# Patient Record
Sex: Female | Born: 1980 | Race: White | Hispanic: No | Marital: Married | Smoking: Never smoker
Health system: Southern US, Community
[De-identification: ages and names within clinical notes are randomized; demographics above are authoritative.]

## PROBLEM LIST (undated history)

## (undated) DIAGNOSIS — F419 Anxiety disorder, unspecified: Secondary | ICD-10-CM

## (undated) HISTORY — DX: Anxiety disorder, unspecified: F41.9

---
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
## 2020-10-22 NOTE — L&D Delivery Note (Signed)
Delivery Note Patient pushed well for 10 minutes.  At 2:12 PM a viable female was delivered via Vaginal, Spontaneous (Presentation: Left Occiput Anterior).  APGAR: 8, 9; weight 3969 gm (8lb 12oz)  .   Placenta status: Spontaneous, Intact.  Cord: 3 vessels with the following complications: None.  Cord pH: n/a  Anesthesia: Epidural Episiotomy: None Lacerations: 2nd degree Suture Repair: 2.0 vicryl rapide Est. Blood Loss (mL): 300  Mom to postpartum.  Baby to Couplet care / Skin to Skin.  Ashley [name] 08/09/2021, 3:01 PM

## 2020-12-29 ENCOUNTER — Encounter: Payer: Self-pay | Admitting: Obstetrics and Gynecology

## 2020-12-29 LAB — OB RESULTS CONSOLE GC/CHLAMYDIA
Chlamydia: NEGATIVE
Gonorrhea: NEGATIVE

## 2021-01-20 ENCOUNTER — Other Ambulatory Visit: Payer: Self-pay | Admitting: Obstetrics and Gynecology

## 2021-01-20 LAB — OB RESULTS CONSOLE ANTIBODY SCREEN: Antibody Screen: NEGATIVE

## 2021-01-20 LAB — OB RESULTS CONSOLE HIV ANTIBODY (ROUTINE TESTING): HIV: NONREACTIVE

## 2021-01-20 LAB — OB RESULTS CONSOLE ABO/RH: RH Type: POSITIVE

## 2021-01-20 LAB — OB RESULTS CONSOLE RUBELLA ANTIBODY, IGM: Rubella: IMMUNE

## 2021-01-20 LAB — OB RESULTS CONSOLE HEPATITIS B SURFACE ANTIGEN: Hepatitis B Surface Ag: NEGATIVE

## 2021-01-20 LAB — OB RESULTS CONSOLE RPR: RPR: NONREACTIVE

## 2021-02-21 ENCOUNTER — Other Ambulatory Visit: Payer: Self-pay | Admitting: Obstetrics and Gynecology

## 2021-02-23 ENCOUNTER — Other Ambulatory Visit: Payer: Self-pay | Admitting: Obstetrics and Gynecology

## 2021-02-23 DIAGNOSIS — Z3A19 19 weeks gestation of pregnancy: Secondary | ICD-10-CM

## 2021-02-23 DIAGNOSIS — O09522 Supervision of elderly multigravida, second trimester: Secondary | ICD-10-CM

## 2021-02-23 DIAGNOSIS — Z363 Encounter for antenatal screening for malformations: Secondary | ICD-10-CM

## 2021-03-07 ENCOUNTER — Telehealth: Payer: Self-pay

## 2021-03-07 NOTE — Telephone Encounter (Signed)
Called to discuss with patient about COVID-19 symptoms and the use of one of the available treatments for those with mild to moderate Covid symptoms and at a high risk of hospitalization.  Pt appears to qualify for outpatient treatment due to co-morbid conditions and/or a member of an at-risk group in accordance with the FDA Emergency Use Authorization.    Symptom onset: Monday, Mar 06, 2021 Vaccinated: Yes Booster: Yes Immunocompromised: No Qualifiers: Pregnant NIH Criteria: Tier 3?  Pt possibly interested in treatment RN informed pt an APP will follow up and discuss available options.   Essie Hart, RN

## 2021-03-08 ENCOUNTER — Telehealth: Payer: Self-pay | Admitting: Adult Health

## 2021-03-08 NOTE — Telephone Encounter (Signed)
Called to discuss with patient about COVID-19 symptoms and the use of one of the available treatments for those with mild to moderate Covid symptoms and at a high risk of hospitalization.  Pt appears to qualify for outpatient treatment due to co-morbid conditions and/or a member of an at-risk group in accordance with the FDA Emergency Use Authorization.    Ashley [name] is feeling better today and I reviewed treatment options with her including paxlovid and bebtelovimab.  She would like to wait one more day to see how she feels, as she is hesitant to take anything while pregnant.  I gave her our call back number.    Ashley [name]

## 2021-03-23 ENCOUNTER — Other Ambulatory Visit: Payer: Self-pay

## 2021-03-23 ENCOUNTER — Ambulatory Visit: Payer: BC Managed Care – PPO | Admitting: *Deleted

## 2021-03-23 ENCOUNTER — Encounter: Payer: Self-pay | Admitting: *Deleted

## 2021-03-23 ENCOUNTER — Other Ambulatory Visit: Payer: Self-pay | Admitting: *Deleted

## 2021-03-23 ENCOUNTER — Ambulatory Visit: Payer: BC Managed Care – PPO | Attending: Obstetrics and Gynecology

## 2021-03-23 VITALS — BP 117/62 | HR 101 | Ht 70.0 in

## 2021-03-23 DIAGNOSIS — Z3A19 19 weeks gestation of pregnancy: Secondary | ICD-10-CM | POA: Insufficient documentation

## 2021-03-23 DIAGNOSIS — Z3689 Encounter for other specified antenatal screening: Secondary | ICD-10-CM | POA: Diagnosis present

## 2021-03-23 DIAGNOSIS — Z363 Encounter for antenatal screening for malformations: Secondary | ICD-10-CM | POA: Diagnosis not present

## 2021-03-23 DIAGNOSIS — O09522 Supervision of elderly multigravida, second trimester: Secondary | ICD-10-CM | POA: Diagnosis present

## 2021-03-23 DIAGNOSIS — O09529 Supervision of elderly multigravida, unspecified trimester: Secondary | ICD-10-CM

## 2021-04-17 ENCOUNTER — Ambulatory Visit: Payer: BC Managed Care – PPO | Attending: Obstetrics and Gynecology

## 2021-04-17 ENCOUNTER — Encounter: Payer: Self-pay | Admitting: *Deleted

## 2021-04-17 ENCOUNTER — Other Ambulatory Visit: Payer: Self-pay

## 2021-04-17 ENCOUNTER — Ambulatory Visit: Payer: BC Managed Care – PPO | Admitting: *Deleted

## 2021-04-17 VITALS — BP 119/77 | HR 97

## 2021-04-17 DIAGNOSIS — O09529 Supervision of elderly multigravida, unspecified trimester: Secondary | ICD-10-CM

## 2021-04-17 DIAGNOSIS — O09522 Supervision of elderly multigravida, second trimester: Secondary | ICD-10-CM | POA: Diagnosis present

## 2021-07-26 LAB — OB RESULTS CONSOLE GBS: GBS: NEGATIVE

## 2021-07-27 ENCOUNTER — Other Ambulatory Visit: Payer: Self-pay | Admitting: Obstetrics and Gynecology

## 2021-08-09 ENCOUNTER — Other Ambulatory Visit: Payer: Self-pay

## 2021-08-09 ENCOUNTER — Inpatient Hospital Stay (HOSPITAL_COMMUNITY)
Admission: AD | Admit: 2021-08-09 | Discharge: 2021-08-10 | DRG: 807 | Disposition: A | Discharge status: Home / Self Care | Payer: BC Managed Care – PPO | Attending: Obstetrics | Admitting: Obstetrics

## 2021-08-09 ENCOUNTER — Inpatient Hospital Stay (HOSPITAL_COMMUNITY): Payer: BC Managed Care – PPO

## 2021-08-09 ENCOUNTER — Encounter (HOSPITAL_COMMUNITY): Payer: Self-pay | Admitting: Obstetrics and Gynecology

## 2021-08-09 ENCOUNTER — Inpatient Hospital Stay (HOSPITAL_COMMUNITY): Payer: BC Managed Care – PPO | Admitting: Anesthesiology

## 2021-08-09 DIAGNOSIS — Z3A39 39 weeks gestation of pregnancy: Secondary | ICD-10-CM | POA: Diagnosis not present

## 2021-08-09 DIAGNOSIS — Z20822 Contact with and (suspected) exposure to covid-19: Secondary | ICD-10-CM | POA: Diagnosis present

## 2021-08-09 DIAGNOSIS — Z23 Encounter for immunization: Secondary | ICD-10-CM | POA: Diagnosis not present

## 2021-08-09 DIAGNOSIS — O26893 Other specified pregnancy related conditions, third trimester: Secondary | ICD-10-CM | POA: Diagnosis present

## 2021-08-09 DIAGNOSIS — O09523 Supervision of elderly multigravida, third trimester: Secondary | ICD-10-CM | POA: Diagnosis present

## 2021-08-09 LAB — RESP PANEL BY RT-PCR (FLU A&B, COVID) ARPGX2
Influenza A by PCR: NEGATIVE
Influenza B by PCR: NEGATIVE
SARS Coronavirus 2 by RT PCR: NEGATIVE

## 2021-08-09 LAB — CBC
HCT: 35.2 % — ABNORMAL LOW (ref 36.0–46.0)
Hemoglobin: 11.1 g/dL — ABNORMAL LOW (ref 12.0–15.0)
MCH: 25.3 pg — ABNORMAL LOW (ref 26.0–34.0)
MCHC: 31.5 g/dL (ref 30.0–36.0)
MCV: 80.2 fL (ref 80.0–100.0)
Platelets: 302 10*3/uL (ref 150–400)
RBC: 4.39 MIL/uL (ref 3.87–5.11)
RDW: 15.3 % (ref 11.5–15.5)
WBC: 9.8 10*3/uL (ref 4.0–10.5)
nRBC: 0 % (ref 0.0–0.2)

## 2021-08-09 LAB — RPR: RPR Ser Ql: NONREACTIVE

## 2021-08-09 LAB — TYPE AND SCREEN
ABO/RH(D): O POS
Antibody Screen: NEGATIVE

## 2021-08-09 MED ORDER — OXYCODONE HCL 5 MG PO TABS: 5.0000 mg | ORAL_TABLET | ORAL | Status: DC | PRN

## 2021-08-09 MED ORDER — BENZOCAINE-MENTHOL 20-0.5 % EX AERO
1.0000 "application " | INHALATION_SPRAY | CUTANEOUS | Status: DC | PRN
  Administered 2021-08-10: 1 via TOPICAL
  Filled 2021-08-09: qty 56

## 2021-08-09 MED ORDER — LIDOCAINE HCL (PF) 1 % IJ SOLN
INTRAMUSCULAR | Status: DC | PRN
  Administered 2021-08-09 (×2): 5 mL via EPIDURAL

## 2021-08-09 MED ORDER — ACETAMINOPHEN 325 MG PO TABS: 650.0000 mg | ORAL_TABLET | ORAL | Status: DC | PRN

## 2021-08-09 MED ORDER — OXYTOCIN-SODIUM CHLORIDE 30-0.9 UT/500ML-% IV SOLN: 2.5000 [IU]/h | INTRAVENOUS | Status: DC

## 2021-08-09 MED ORDER — EPHEDRINE 5 MG/ML INJ: 10.0000 mg | INTRAVENOUS | Status: DC | PRN

## 2021-08-09 MED ORDER — ONDANSETRON HCL 4 MG/2ML IJ SOLN: 4.0000 mg | INTRAMUSCULAR | Status: DC | PRN

## 2021-08-09 MED ORDER — OXYCODONE-ACETAMINOPHEN 5-325 MG PO TABS: 1.0000 | ORAL_TABLET | ORAL | Status: DC | PRN

## 2021-08-09 MED ORDER — ONDANSETRON HCL 4 MG/2ML IJ SOLN: 4.0000 mg | Freq: Four times a day (QID) | INTRAMUSCULAR | Status: DC | PRN

## 2021-08-09 MED ORDER — FLEET ENEMA 7-19 GM/118ML RE ENEM: 1.0000 | ENEMA | RECTAL | Status: DC | PRN

## 2021-08-09 MED ORDER — FENTANYL-BUPIVACAINE-NACL 0.5-0.125-0.9 MG/250ML-% EP SOLN
EPIDURAL | Status: AC
  Filled 2021-08-09: qty 250

## 2021-08-09 MED ORDER — TETANUS-DIPHTH-ACELL PERTUSSIS 5-2.5-18.5 LF-MCG/0.5 IM SUSY: 0.5000 mL | PREFILLED_SYRINGE | Freq: Once | INTRAMUSCULAR | Status: DC

## 2021-08-09 MED ORDER — DIBUCAINE (PERIANAL) 1 % EX OINT: 1.0000 "application " | TOPICAL_OINTMENT | CUTANEOUS | Status: DC | PRN

## 2021-08-09 MED ORDER — SOD CITRATE-CITRIC ACID 500-334 MG/5ML PO SOLN: 30.0000 mL | ORAL | Status: DC | PRN

## 2021-08-09 MED ORDER — WITCH HAZEL-GLYCERIN EX PADS
1.0000 "application " | MEDICATED_PAD | CUTANEOUS | Status: DC | PRN
  Administered 2021-08-10: 1 via TOPICAL

## 2021-08-09 MED ORDER — INFLUENZA VAC SPLIT QUAD 0.5 ML IM SUSY
0.5000 mL | PREFILLED_SYRINGE | INTRAMUSCULAR | Status: AC
  Administered 2021-08-10: 0.5 mL via INTRAMUSCULAR
  Filled 2021-08-09: qty 0.5

## 2021-08-09 MED ORDER — OXYTOCIN BOLUS FROM INFUSION
333.0000 mL | Freq: Once | INTRAVENOUS | Status: AC
  Administered 2021-08-09: 333 mL via INTRAVENOUS

## 2021-08-09 MED ORDER — COCONUT OIL OIL
1.0000 "application " | TOPICAL_OIL | Status: DC | PRN
  Administered 2021-08-10: 1 via TOPICAL

## 2021-08-09 MED ORDER — OXYCODONE-ACETAMINOPHEN 5-325 MG PO TABS: 2.0000 | ORAL_TABLET | ORAL | Status: DC | PRN

## 2021-08-09 MED ORDER — FAMOTIDINE 20 MG PO TABS: 40.0000 mg | ORAL_TABLET | Freq: Every day | ORAL | Status: DC | PRN

## 2021-08-09 MED ORDER — LIDOCAINE HCL (PF) 1 % IJ SOLN: 30.0000 mL | INTRAMUSCULAR | Status: DC | PRN

## 2021-08-09 MED ORDER — MISOPROSTOL 25 MCG QUARTER TABLET
25.0000 ug | ORAL_TABLET | ORAL | Status: DC | PRN
  Administered 2021-08-09: 25 ug via VAGINAL
  Filled 2021-08-09 (×2): qty 1

## 2021-08-09 MED ORDER — TERBUTALINE SULFATE 1 MG/ML IJ SOLN: 0.2500 mg | Freq: Once | INTRAMUSCULAR | Status: DC | PRN

## 2021-08-09 MED ORDER — ONDANSETRON HCL 4 MG PO TABS: 4.0000 mg | ORAL_TABLET | ORAL | Status: DC | PRN

## 2021-08-09 MED ORDER — LACTATED RINGERS IV SOLN
500.0000 mL | Freq: Once | INTRAVENOUS | Status: AC
  Administered 2021-08-09: 500 mL via INTRAVENOUS

## 2021-08-09 MED ORDER — LACTATED RINGERS IV SOLN
500.0000 mL | INTRAVENOUS | Status: DC | PRN
  Administered 2021-08-09: 500 mL via INTRAVENOUS

## 2021-08-09 MED ORDER — PHENYLEPHRINE 40 MCG/ML (10ML) SYRINGE FOR IV PUSH (FOR BLOOD PRESSURE SUPPORT): 80.0000 ug | PREFILLED_SYRINGE | INTRAVENOUS | Status: DC | PRN

## 2021-08-09 MED ORDER — IBUPROFEN 600 MG PO TABS
600.0000 mg | ORAL_TABLET | Freq: Four times a day (QID) | ORAL | Status: DC
  Administered 2021-08-09 – 2021-08-10 (×5): 600 mg via ORAL
  Filled 2021-08-09 (×5): qty 1

## 2021-08-09 MED ORDER — SENNOSIDES-DOCUSATE SODIUM 8.6-50 MG PO TABS
2.0000 | ORAL_TABLET | ORAL | Status: DC
  Administered 2021-08-09 – 2021-08-10 (×2): 2 via ORAL
  Filled 2021-08-09 (×2): qty 2

## 2021-08-09 MED ORDER — OXYCODONE HCL 5 MG PO TABS: 10.0000 mg | ORAL_TABLET | ORAL | Status: DC | PRN

## 2021-08-09 MED ORDER — FENTANYL-BUPIVACAINE-NACL 0.5-0.125-0.9 MG/250ML-% EP SOLN
12.0000 mL/h | EPIDURAL | Status: DC | PRN
  Administered 2021-08-09: 12 mL/h via EPIDURAL

## 2021-08-09 MED ORDER — FENTANYL CITRATE (PF) 100 MCG/2ML IJ SOLN: 100.0000 ug | INTRAMUSCULAR | Status: DC | PRN

## 2021-08-09 MED ORDER — DIPHENHYDRAMINE HCL 50 MG/ML IJ SOLN: 12.5000 mg | INTRAMUSCULAR | Status: DC | PRN

## 2021-08-09 MED ORDER — PHENYLEPHRINE 40 MCG/ML (10ML) SYRINGE FOR IV PUSH (FOR BLOOD PRESSURE SUPPORT)
80.0000 ug | PREFILLED_SYRINGE | INTRAVENOUS | Status: DC | PRN
  Filled 2021-08-09: qty 10

## 2021-08-09 MED ORDER — DIPHENHYDRAMINE HCL 25 MG PO CAPS: 25.0000 mg | ORAL_CAPSULE | Freq: Four times a day (QID) | ORAL | Status: DC | PRN

## 2021-08-09 MED ORDER — PRENATAL MULTIVITAMIN CH
1.0000 | ORAL_TABLET | Freq: Every day | ORAL | Status: DC
  Administered 2021-08-10: 1 via ORAL
  Filled 2021-08-09: qty 1

## 2021-08-09 MED ORDER — SIMETHICONE 80 MG PO CHEW: 80.0000 mg | CHEWABLE_TABLET | ORAL | Status: DC | PRN

## 2021-08-09 MED ORDER — OXYTOCIN-SODIUM CHLORIDE 30-0.9 UT/500ML-% IV SOLN
1.0000 m[IU]/min | INTRAVENOUS | Status: DC
  Administered 2021-08-09: 2 m[IU]/min via INTRAVENOUS
  Filled 2021-08-09: qty 500

## 2021-08-09 MED ORDER — BUTORPHANOL TARTRATE 1 MG/ML IJ SOLN: 1.0000 mg | INTRAMUSCULAR | Status: DC | PRN

## 2021-08-09 MED ORDER — LACTATED RINGERS IV SOLN: INTRAVENOUS | Status: DC

## 2021-08-09 NOTE — Anesthesia Procedure Notes (Signed)
Epidural Patient location during procedure: OB Start time: 08/09/2021 8:34 AM End time: 08/09/2021 9:02 AM  Staffing Anesthesiologist: Heather Roberts, MD Performed: anesthesiologist   Preanesthetic Checklist Completed: patient identified, IV checked, site marked, risks and benefits discussed, monitors and equipment checked, pre-op evaluation and timeout performed  Epidural Patient position: sitting Prep: DuraPrep Patient monitoring: heart rate, cardiac monitor, continuous pulse ox and blood pressure Approach: midline Location: L2-L3 Injection technique: LOR saline  Needle:  Needle type: Tuohy  Needle gauge: 17 G Needle length: 9 cm Needle insertion depth: 6 cm Catheter size: 20 Guage Catheter at skin depth: 11 cm Test dose: negative and Other  Assessment Events: blood not aspirated, injection not painful, no injection resistance and negative IV test  Additional Notes Informed consent obtained prior to proceeding including risk of failure, 1% risk of PDPH, risk of minor discomfort and bruising.  Discussed rare but serious complications including epidural abscess, permanent nerve injury, epidural hematoma.  Discussed alternatives to epidural analgesia and patient desires to proceed.  Timeout performed pre-procedure verifying patient name, procedure, and platelet count.  Patient tolerated procedure well.

## 2021-08-09 NOTE — H&P (Signed)
40 y.o. G2P1001 @ [redacted]w[redacted]d presents for  induction of labor for advanced maternal age.  Otherwise has good fetal movement and no bleeding.  Pregnancy complicated by: Advanced maternal age: low risk NIPT Failed 1hr gtt,passed 3hr gtt  Past Medical History:  Diagnosis Date   Anxiety     Past Surgical History:  Procedure Laterality Date   WISDOM TOOTH EXTRACTION  2002    OB History  Gravida Para Term Preterm AB Living  2 1 1     1   SAB IAB Ectopic Multiple Live Births          1    # Outcome Date GA Lbr Len/2nd Weight Sex Delivery Anes PTL Lv  2 Current           1 Term             Social History   Socioeconomic History   Marital status: Married    Spouse name: Not on file   Number of children: Not on file   Years of education: Not on file   Highest education level: Not on file  Occupational History   Not on file  Tobacco Use   Smoking status: Never   Smokeless tobacco: Never  Vaping Use   Vaping Use: Never used  Substance and Sexual Activity   Alcohol use: Not Currently   Drug use: Never   Sexual activity: Yes  Other Topics Concern   Not on file  Social History Narrative   Not on file   Social Determinants of Health   Financial Resource Strain: Not on file  Food Insecurity: Not on file  Transportation Needs: Not on file  Physical Activity: Not on file  Stress: Not on file  Social Connections: Not on file  Intimate Partner Violence: Not on file   Patient has no known allergies.    Prenatal Transfer Tool  Maternal Diabetes: No Genetic Screening: Normal Maternal Ultrasounds/Referrals: Normal Fetal Ultrasounds or other Referrals:  None Maternal Substance Abuse:  No Significant Maternal Medications:  None Significant Maternal Lab Results: Group B Strep negative  ABO, Rh: --/--/O POS (10/19 0018) Antibody: NEG (10/19 0018) Rubella: Immune (04/01 0000) RPR: Nonreactive (04/01 0000)  HBsAg: Negative (04/01 0000)  HIV: Non-reactive (04/01 0000)  GBS:  Negative/-- (10/05 0000)      Vitals:   08/09/21 0936 08/09/21 1002  BP: 106/64 (!) 101/50  Pulse: 80 (!) 108  Resp: 17   Temp:       General:  NAD Abdomen:  soft, gravid, EFW 8-8.5# Ex:  trace edema SVE:  5/60/-2, , cephalic, AROM clear fluid  FHTs:  130s, moderate variability, + accelerations and scalp stimulation Toco:  q2 minutes on 35mU of pitocin   A/P   40 y.o. G2P1001 [redacted]w[redacted]d presents for IOL for advanced maternal age IOL:  on pitocin, s/p AROM Anticipate SVD GBS negative  Ashley [name] Ashley [name]

## 2021-08-09 NOTE — Lactation Note (Signed)
This note was copied from a baby's chart. Lactation Consultation Note  Patient Name: Ashley [name] [id number] Date: 08/09/2021 Reason for consult: L&D Initial assessment Age:38 hours  P2, Ex BF.  Mother latched baby in cradle hold and repositioned for depth.  Provided pillows for support. Lactation to follow up on MBU.   Maternal Data Does the patient have breastfeeding experience prior to this delivery?: Yes  Feeding Mother's Current Feeding Choice: Breast Milk  LATCH Score Latch: Grasps breast easily, tongue down, lips flanged, rhythmical sucking.  Audible Swallowing: A few with stimulation  Type of Nipple: Everted at rest and after stimulation  Comfort (Breast/Nipple): Soft / non-tender  Hold (Positioning): Assistance needed to correctly position infant at breast and maintain latch.  LATCH Score: 8   Interventions Interventions: Assisted with latch;Skin to skin;Education   Consult Status Consult Status: Follow-up from L&D    Ashley [name] [facility] 08/09/2021, 2:58 PM

## 2021-08-09 NOTE — Anesthesia Preprocedure Evaluation (Signed)
Anesthesia Evaluation  Patient identified by MRN, date of birth, ID band Patient awake    Reviewed: Allergy & Precautions, NPO status , Patient's Chart, lab work & pertinent test results  Airway Mallampati: II  TM Distance: >3 FB Neck ROM: Full    Dental no notable dental hx. (+) Dental Advisory Given   Pulmonary neg pulmonary ROS,    Pulmonary exam normal        Cardiovascular negative cardio ROS Normal cardiovascular exam     Neuro/Psych PSYCHIATRIC DISORDERS Anxiety negative neurological ROS     GI/Hepatic negative GI ROS, Neg liver ROS,   Endo/Other  negative endocrine ROS  Renal/GU negative Renal ROS  negative genitourinary   Musculoskeletal negative musculoskeletal ROS (+)   Abdominal   Peds negative pediatric ROS (+)  Hematology negative hematology ROS (+)   Anesthesia Other Findings   Reproductive/Obstetrics (+) Pregnancy                             Anesthesia Physical Anesthesia Plan  ASA: 2  Anesthesia Plan: Epidural   Post-op Pain Management:    Induction:   PONV Risk Score and Plan:   Airway Management Planned:   Additional Equipment:   Intra-op Plan:   Post-operative Plan:   Informed Consent: I have reviewed the patients History and Physical, chart, labs and discussed the procedure including the risks, benefits and alternatives for the proposed anesthesia with the patient or authorized representative who has indicated his/her understanding and acceptance.     Dental advisory given  Plan Discussed with: Anesthesiologist  Anesthesia Plan Comments:         Anesthesia Quick Evaluation

## 2021-08-10 LAB — CBC
HCT: 30.5 % — ABNORMAL LOW (ref 36.0–46.0)
Hemoglobin: 9.7 g/dL — ABNORMAL LOW (ref 12.0–15.0)
MCH: 25.2 pg — ABNORMAL LOW (ref 26.0–34.0)
MCHC: 31.8 g/dL (ref 30.0–36.0)
MCV: 79.2 fL — ABNORMAL LOW (ref 80.0–100.0)
Platelets: 316 10*3/uL (ref 150–400)
RBC: 3.85 MIL/uL — ABNORMAL LOW (ref 3.87–5.11)
RDW: 15.5 % (ref 11.5–15.5)
WBC: 15.6 10*3/uL — ABNORMAL HIGH (ref 4.0–10.5)
nRBC: 0 % (ref 0.0–0.2)

## 2021-08-10 MED ORDER — IBUPROFEN 600 MG PO TABS: 600.0000 mg | ORAL_TABLET | Freq: Four times a day (QID) | ORAL | 1 refills | Status: AC | PRN

## 2021-08-10 NOTE — Discharge Summary (Signed)
Postpartum Discharge Summary  Date of Service updated      Patient Name: Ashley [name] DOB: August 22, 1981 MRN: [id number]  Date of admission: 08/09/2021 Delivery date:08/09/2021  Delivering provider: Marlow Baars  Date of discharge: 08/10/2021  Admitting diagnosis: AMA (advanced maternal age) multigravida 35+, third trimester [O09.523] Intrauterine pregnancy: [redacted]w[redacted]d     Secondary diagnosis:  Active Problems:   AMA (advanced maternal age) multigravida 35+, third trimester  Additional problems: n/a    Discharge diagnosis: Term Pregnancy Delivered                                              Post partum procedures: n/a Augmentation: AROM and Pitocin Complications: None  Hospital course: Induction of Labor With Vaginal Delivery   40 y.o. yo G2P2002 at [redacted]w[redacted]d was admitted to the hospital 08/09/2021 for induction of labor.  Indication for induction: Favorable cervix at term.  Patient had an uncomplicated labor course as follows: Membrane Rupture Time/Date: 10:20 AM ,08/09/2021   Delivery Method:Vaginal, Spontaneous  Episiotomy: None  Lacerations:  2nd degree  Details of delivery can be found in separate delivery note.  Patient had a routine postpartum course. Patient is discharged home 08/10/21.  Newborn Data: Birth date:08/09/2021  Birth time:2:12 PM  Gender:Female  Living status:Living  Apgars:8 ,9  Weight:3969 g   Magnesium Sulfate received: No BMZ received: No  Physical exam  Vitals:   08/09/21 2100 08/10/21 0110 08/10/21 0526 08/10/21 1353  BP: 121/77 113/68 122/84 124/76  Pulse: (!) 105 (!) 102 89 89  Resp: 18 18 18 16   Temp: 98.6 F (37 C) 98.4 F (36.9 C) 98.3 F (36.8 C) 98.3 F (36.8 C)  TempSrc: Oral Oral Oral Oral  SpO2: 99% 99% 100%   Weight:      Height:       General: alert, cooperative, and no distress Lochia: appropriate Uterine Fundus: firm Incision: N/A DVT Evaluation: No evidence of DVT seen on physical exam. Labs: Lab Results   Component Value Date   WBC 15.6 (H) 08/10/2021   HGB 9.7 (L) 08/10/2021   HCT 30.5 (L) 08/10/2021   MCV 79.2 (L) 08/10/2021   PLT 316 08/10/2021   No flowsheet data found. Edinburgh Score: Edinburgh Postnatal Depression Scale Screening Tool 08/10/2021  I have been able to laugh and see the funny side of things. 0  I have looked forward with enjoyment to things. 0  I have blamed myself unnecessarily when things went wrong. 1  I have been anxious or worried for no good reason. 2  I have felt scared or panicky for no good reason. 1  Things have been getting on top of me. 1  I have been so unhappy that I have had difficulty sleeping. 1  I have felt sad or miserable. 1  I have been so unhappy that I have been crying. 0  The thought of harming myself has occurred to me. 0  Edinburgh Postnatal Depression Scale Total 7      After visit meds:  Allergies as of 08/10/2021   No Known Allergies      Medication List     TAKE these medications    acetaminophen 325 MG tablet Commonly known as: TYLENOL Take 650 mg by mouth every 6 (six) hours as needed.   famotidine 40 MG tablet Commonly known as: PEPCID Take 40 mg by  mouth daily.   guaiFENesin 100 MG/5ML Soln Commonly known as: ROBITUSSIN Take 5 mLs by mouth every 4 (four) hours as needed for cough or to loosen phlegm.   ibuprofen 600 MG tablet Commonly known as: ADVIL Take 1 tablet (600 mg total) by mouth every 6 (six) hours as needed for moderate pain or cramping.   loratadine 10 MG tablet Commonly known as: CLARITIN Take 10 mg by mouth daily.   prenatal multivitamin Tabs tablet Take 1 tablet by mouth daily at 12 noon.         Discharge home in stable condition Infant Feeding: Breast Infant Disposition:home with mother Discharge instruction: per After Visit Summary and Postpartum booklet. Activity: Advance as tolerated. Pelvic rest for 6 weeks.  Diet: routine diet Anticipated Birth Control: Unsure Postpartum  Appointment:6 weeks Additional Postpartum F/U: Postpartum Depression checkup Future Appointments:No future appointments. Follow up Visit:  Follow-up Information     Ob/Gyn, Nestor Ramp Follow up in 6 week(s).   Why: For postpartum visit Contact information: [address] [place] [address] [phone]                     08/10/2021 Cathrine Muster, DO

## 2021-08-10 NOTE — Progress Notes (Signed)
Post Partum Day 1 Subjective: no complaints, up ad lib, voiding, tolerating PO, + flatus, and lochia mild. She is bonding well with baby - peds watching bilirubin levels. She denies HA, CP, SOB .   Objective: Blood pressure 122/84, pulse 89, temperature 98.3 F (36.8 C), temperature source Oral, resp. rate 18, height 5\' 10"  (1.778 m), weight 90.8 kg, last menstrual period 10/30/2020, SpO2 100 %, unknown if currently breastfeeding.  Physical Exam:  General: alert, cooperative, and no distress Lochia: appropriate Uterine Fundus: firm Incision: n/a DVT Evaluation: No evidence of DVT seen on physical exam. Calf/Ankle edema is present.  Recent Labs    08/09/21 0028 08/10/21 0430  HGB 11.1* 9.7*  HCT 35.2* 30.5*    Assessment/Plan: Plan for discharge tomorrow and Breastfeeding If baby stable to discharge home later today, I will discharge pt as well   LOS: 1 day   Elija Mccamish W Janyia Guion 08/10/2021, 11:38 AM

## 2021-08-10 NOTE — Lactation Note (Signed)
This note was copied from a baby's chart. Lactation Consultation Note  Patient Name: Ashley [name] [id number] Date: 08/10/2021 Reason for consult: Follow-up assessment Age:38 hours  P2, Ex BF.  Mother is starting to get sore.  She had substantial soreness with first child. Noted some redness on tips and base of nipples. Baby recently fed.  Discussed various positions to improve depth and suggest chin tug to wide latch. Mother will call if she needs assistance. Provided mother with coconut oil.   Maternal Data Has patient been taught Hand Expression?: Yes Does the patient have breastfeeding experience prior to this delivery?: Yes  Feeding Mother's Current Feeding Choice: Breast Milk   Interventions Interventions: Breast feeding basics reviewed;Coconut oil;Education;LC Services brochure   Consult Status Consult Status: Follow-up Date: 08/11/21 Follow-up type: In-patient    Dahlia Byes [facility] 08/10/2021, 12:25 PM

## 2021-08-10 NOTE — Lactation Note (Signed)
This note was copied from a baby's chart. Lactation Consultation Note  Patient Name: Ashley [name] [id number] Date: 08/10/2021 Reason for consult: Follow-up assessment;Term;Maternal discharge;Nipple pain/trauma Age:38 hours  LC checked on latch. Mom earlier complaining of nipple pain. LC assisted with latch and pinching resolved. LC demonstrated cross cradle with breast compression and audible swallows.   Mom given comfort gels for pain. Mom aware to not use with coconut oil and rinse in between use.   Mom personal pump at home.  All questions answered at the end of the visit.   Maternal Data    Feeding Mother's Current Feeding Choice: Breast Milk  LATCH Score Latch: Repeated attempts needed to sustain latch, nipple held in mouth throughout feeding, stimulation needed to elicit sucking reflex.  Audible Swallowing: Spontaneous and intermittent  Type of Nipple: Everted at rest and after stimulation  Comfort (Breast/Nipple): Filling, red/small blisters or bruises, mild/mod discomfort  Hold (Positioning): Assistance needed to correctly position infant at breast and maintain latch.  LATCH Score: 7   Lactation Tools Discussed/Used Tools: Coconut oil;Comfort gels  Interventions Interventions: Breast feeding basics reviewed;Support pillows;Education;Assisted with latch;Position options;Skin to skin;Expressed milk;Breast massage;Coconut oil;LC Services brochure;Infant Driven Feeding Algorithm education;Comfort gels;Breast compression;Adjust position  Discharge Discharge Education: Engorgement and breast care;Warning signs for feeding baby Pump: Personal  Consult Status Consult Status: Complete Date: 08/10/21    Ashley Mihalko  [name] 08/10/2021, 4:52 PM

## 2021-08-10 NOTE — Anesthesia Postprocedure Evaluation (Signed)
Anesthesia Post Note  Patient: Ashley [name]  Procedure(s) Performed: AN AD HOC LABOR EPIDURAL     Patient location during evaluation: Mother Baby Anesthesia Type: Epidural Level of consciousness: awake and alert Pain management: pain level controlled Vital Signs Assessment: post-procedure vital signs reviewed and stable Respiratory status: spontaneous breathing, nonlabored ventilation and respiratory function stable Cardiovascular status: stable Postop Assessment: no headache, no backache and epidural receding Anesthetic complications: no   No notable events documented.  Last Vitals:  Vitals:   08/10/21 0110 08/10/21 0526  BP: 113/68 122/84  Pulse: (!) 102 89  Resp: 18 18  Temp: 36.9 C 36.8 C  SpO2: 99% 100%    Last Pain:  Vitals:   08/10/21 1155  TempSrc:   PainSc: 0-No pain   Pain Goal:                   Carlyn Mullenbach

## 2021-08-10 NOTE — Progress Notes (Signed)
Patient ID: Ashley [name], female   DOB: 05/02/1981, 40 y.o.   MRN: [id number] Baby discharged to home by pediatrician  Pt feels stable to be discharged  home as well

## 2021-08-10 NOTE — Discharge Instructions (Signed)
Call office with any concerns [phone] 

## 2021-08-10 NOTE — Social Work (Signed)
CSW received consult for hx of Anxiety. CSW met with MOB to offer support and complete assessment.    CSW met with MOB at bedside and introduced CSW role. CSW observed MOB breastfeeding the infant. MOB presented calm and welcomed CSW visit. CSW inquired how MOB has felt since giving birth. MOB expressed feeling okay, the L&D went smoother and was a lot less stressful than the previous birth. CSW inquired how MOB felt during the pregnancy. MOB shared she felt good but mostly tired due to work. CSW inquired about MOB history of anxiety. MOB shared she has a history of generalized anxiety. She currently sees a therapist and will continue to see postpartum. MOB reported no medication treatment. CSW inquired if MOB experienced PPD. MOB she did not experienced PPD. CSW provided education regarding the baby blues period vs. perinatal mood disorders, discussed treatment and gave resources for mental health follow up if concerns arise.  CSW recommended MOB complete a self-evaluation during the postpartum time period using the New Mom Checklist from Postpartum Progress and encouraged MOB to contact a medical professional if symptoms are noted at any time. MOB denied SI/HI. CSW inquired about MOB supports. MOB identified her spouse, in-laws, parents and friends as supports.   CSW provided review of Sudden Infant Death Syndrome (SIDS) precautions. MOB reported she has essential items for the infant including a bassinet where the infant will sleep. MOB had chosen [facility] for infant's follow up care. CSW assessed MOB for additional needs. MOB reported no further needs.   CSW identifies no further need for intervention and no barriers to discharge at this time.   Kathrin Greathouse, MSW, LCSW [facility] [place] Work[facility]  [phone] 08/10/2021  12:43 PM

## 2021-08-22 ENCOUNTER — Telehealth (HOSPITAL_COMMUNITY): Payer: Self-pay

## 2021-08-22 NOTE — Telephone Encounter (Signed)
"  I'm doing great. I'm feeling pretty good. Still having some cramps but taking the motrin for it." Patient has no questions or concerns about her healing.  "She's doing really good. She sleeps in a bassinet next to our bed."RN reviewed ABC's of safe sleep with patient. Patient declines any questions or concerns about baby.  EPDS score is 4.   Marcelino Duster [facility] 08/22/2021,1336

## 2022-02-01 IMAGING — US US MFM OB DETAIL+14 WK
1 series · 13 of 28 positions shown · non-contrast
Comparison: none

[Series 1: us mfm ob detail+14 wk · 13 of 114 slices shown]
[im 5/114]
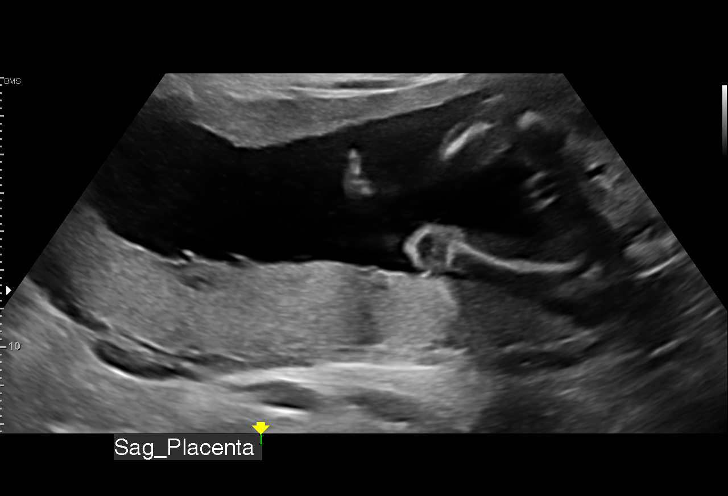
[im 13/114]
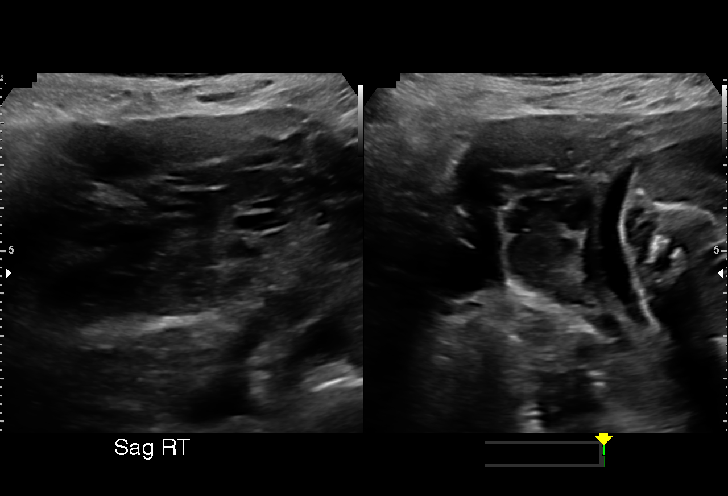
[im 21/114]
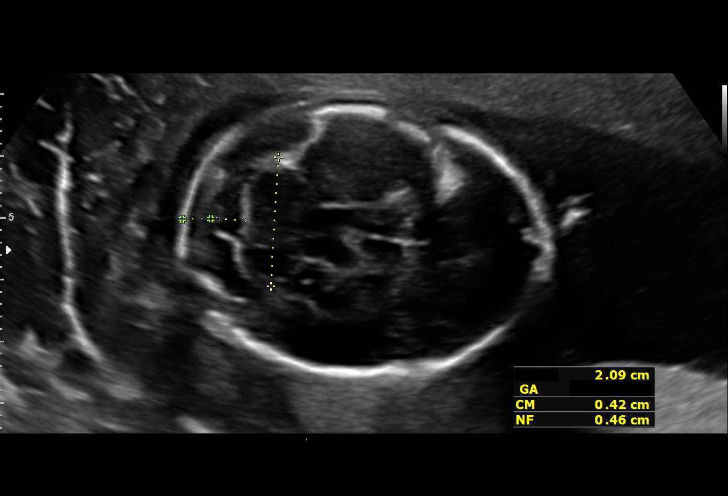
[im 30/114]
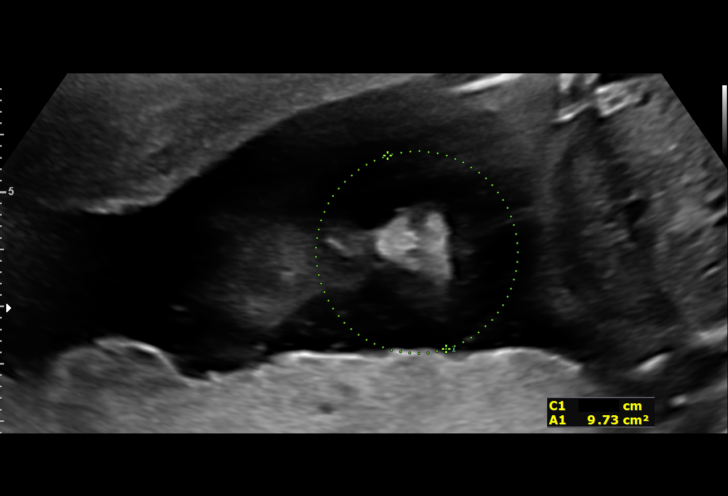
[im 38/114]
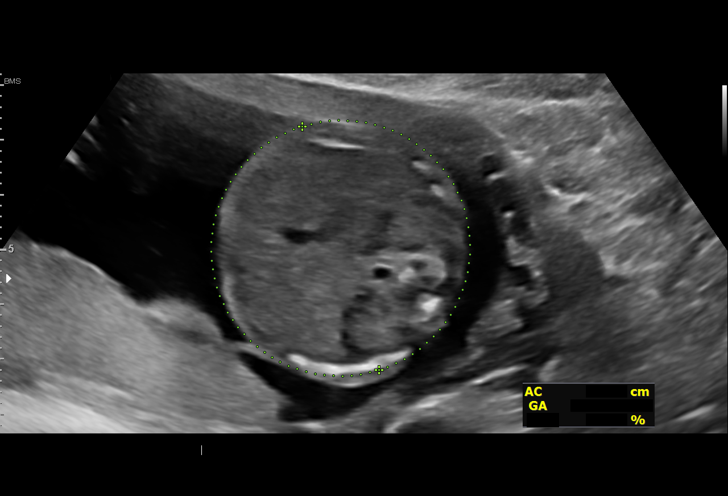
[im 47/114]
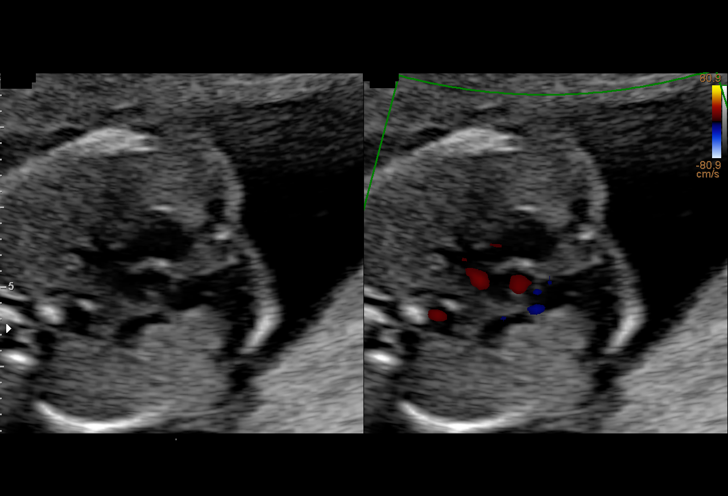
[im 59/114]
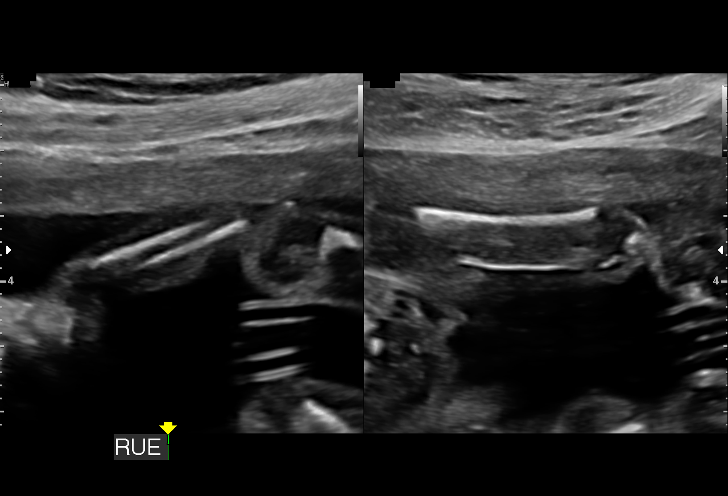
[im 67/114]
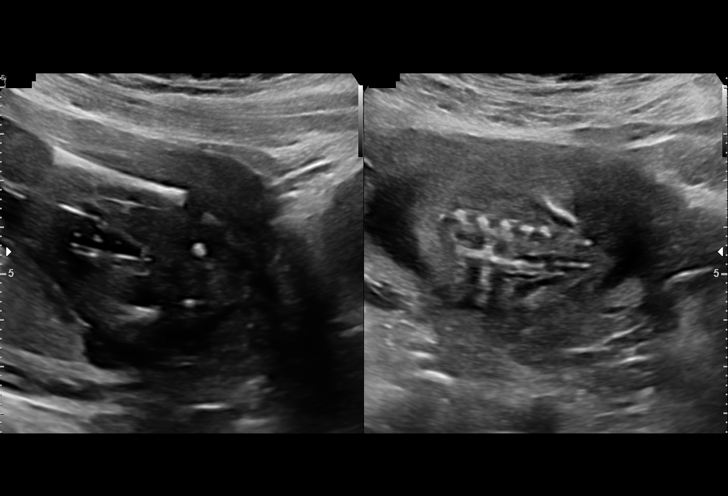
[im 76/114]
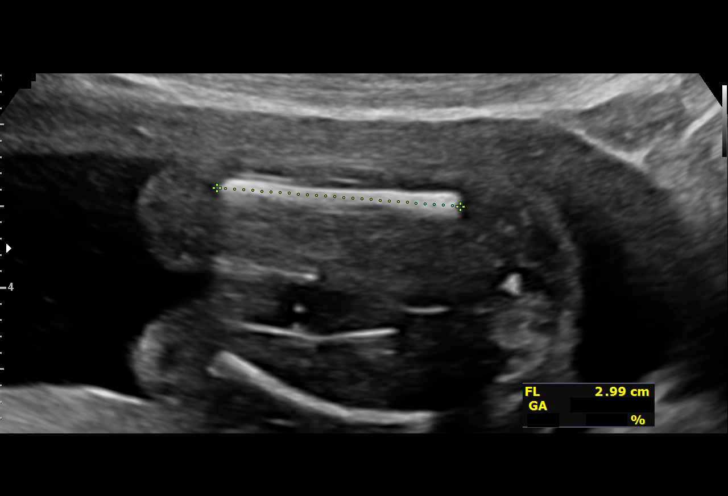
[im 84/114]
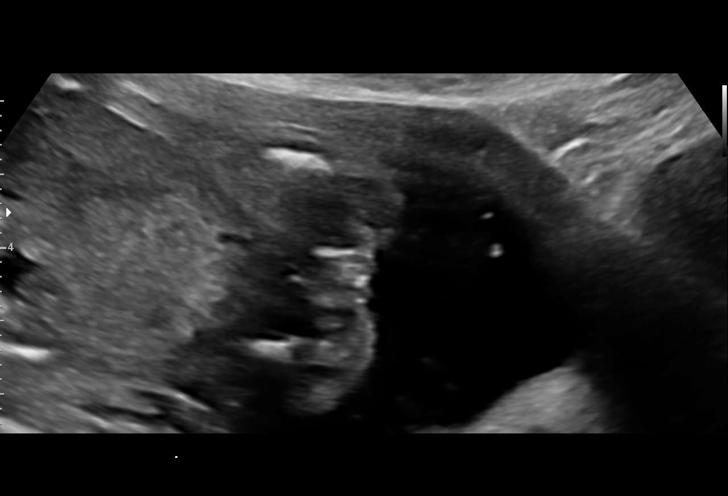
[im 93/114]
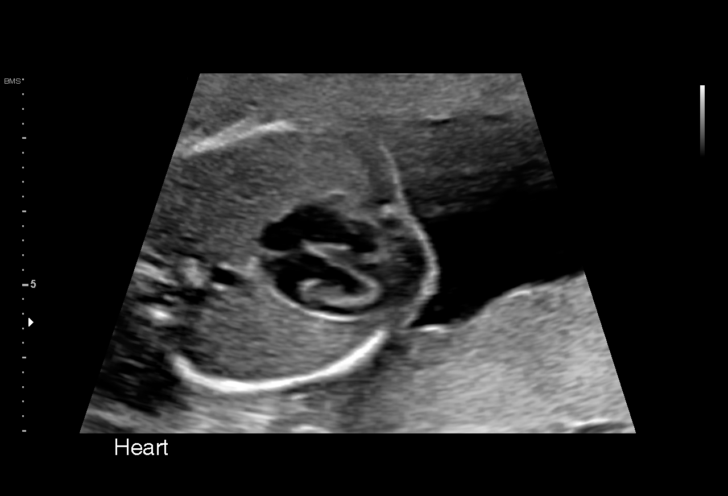
[im 101/114]
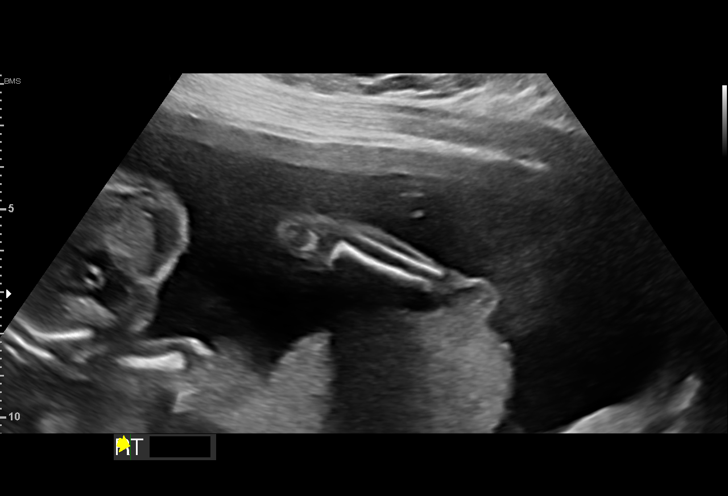
[im 109/114]
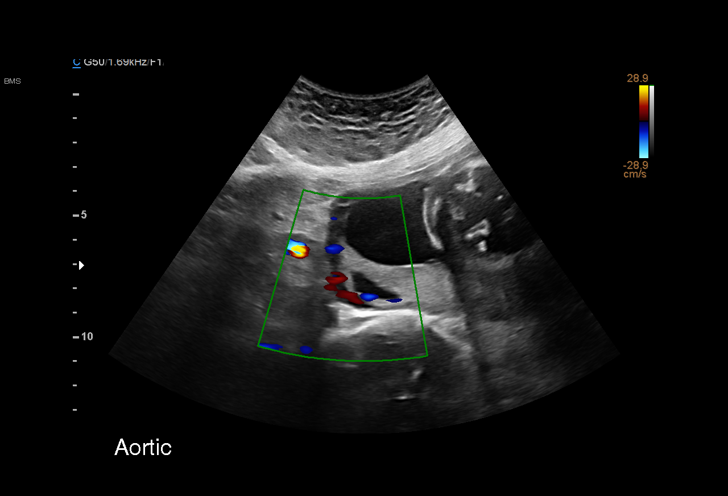

[13 of 28 positions shown; findings below may reference images not displayed]

OBGYN
                                                            [REDACTED]
                   [facility]

Indications

 Encounter for antenatal screening for
 malformations (LR NIPS, Neg AFP)
 19 weeks gestation of pregnancy
 Advanced maternal age multigravida 35+,
 second trimester
 Medical complication of pregnancy (Covid
 03/08/21)
Fetal Evaluation

 Num Of Fetuses:         1
 Fetal Heart Rate(bpm):  144
 Cardiac Activity:       Observed
 Presentation:           Breech
 Placenta:               Posterior Fundal
 P. Cord Insertion:      Visualized, central

 Amniotic Fluid
 AFI FV:      Within normal limits

                             Largest Pocket(cm)


 Comment:    Placental lake visualized. Posterior fundal placenta 3.55cm from
             cervix.
Biometry
 BPD:      44.5  mm     G. Age:  19w 3d         39  %    CI:        69.22   %    70 - 86
                                                         FL/HC:      17.4   %    16.8 -
 HC:      170.8  mm     G. Age:  19w 5d         40  %    HC/AC:      1.15        1.09 -
 AC:      148.2  mm     G. Age:  20w 1d         58  %    FL/BPD:     67.0   %
 FL:       29.8  mm     G. Age:  19w 1d         25  %    FL/AC:      20.1   %    20 - 24
 HUM:      27.1  mm     G. Age:  18w 4d         24  %
 CER:      20.9  mm     G. Age:  20w 0d         73  %
 NFT:       4.6  mm

 LV:        6.1  mm
 CM:        4.2  mm

 Est. FW:     306  gm    0 lb 11 oz      43  %
OB History

 Gravidity:    2
 Living:       1
Gestational Age

 LMP:           20w 4d        Date:  10/30/20                 EDD:   08/06/21
 U/S Today:     19w 4d                                        EDD:   08/13/21
 Best:          19w 5d     Det. By:  Early Ultrasound         EDD:   08/12/21
                                     (12/29/20)
Anatomy

 Cranium:               Appears normal         Aortic Arch:            Appears normal
 Cavum:                 Appears normal         Ductal Arch:            Not well visualized
 Ventricles:            Appears normal         Diaphragm:              Appears normal
 Choroid Plexus:        Appears normal         Stomach:                Appears normal, left
                                                                       sided
 Cerebellum:            Appears normal         Abdomen:                Appears normal
 Posterior Fossa:       Appears normal         Abdominal Wall:         Appears nml (cord
                                                                       insert, abd wall)
 Nuchal Fold:           Appears normal         Cord Vessels:           Appears normal (3
                                                                       vessel cord)
 Face:                  Appears normal         Kidneys:                Appear normal
                        (orbits and profile)
 Lips:                  Appears normal         Bladder:                Appears normal
 Thoracic:              Appears normal         Spine:                  Ltd views no
                                                                       intracranial signs of
                                                                       NTD
 Heart:                 Not well visualized    Upper Extremities:      Appears normal
 RVOT:                  Not well visualized    Lower Extremities:      Appears normal
 LVOT:                  Appears normal

 Other:  Lenses visualized. Nasal bone visualized. Fetus appears to be
         female. Heels/feet and open hands/5th digits visualized. Technically
         difficult due to fetal position.
Cervix Uterus Adnexa

 Cervix
 Length:           2.97  cm.
 Not visualized (advanced GA >36wks)
 Uterus
 No abnormality visualized.

 Right Ovary
 Not visualized.

 Left Ovary
 Within normal limits.

 Cul De Sac
 No free fluid seen.

 Adnexa
 No adnexal mass visualized.
Impression

 G2 P1. Patient is here for fetal anatomy.

 On cell-free fetal DNA screening, the risks of fetal
 aneuploidies are not increased .MSAFP screening showed
 low risk for open-neural tube defects .

 We performed fetal anatomy scan. No makers of
 aneuploidies or fetal structural defects are seen. Fetal
 biometry is consistent with her previously-established dates.
 Amniotic fluid is normal and good fetal activity is seen.
 Patient understands the limitations of ultrasound in detecting
 fetal anomalies.

Recommendations

 -An appointment was made for her to return in 4 weeks for
 completion of fetal anatomy.
                 Permission, Jean Frand

## 2024-06-02 ENCOUNTER — Other Ambulatory Visit: Payer: Self-pay | Admitting: Obstetrics and Gynecology

## 2024-06-02 DIAGNOSIS — R928 Other abnormal and inconclusive findings on diagnostic imaging of breast: Secondary | ICD-10-CM

## 2024-06-10 ENCOUNTER — Ambulatory Visit
Admission: RE | Admit: 2024-06-10 | Discharge: 2024-06-10 | Disposition: A | Discharge status: Home / Self Care | Payer: Self-pay | Source: Ambulatory Visit | Attending: Obstetrics and Gynecology | Admitting: Obstetrics and Gynecology

## 2024-06-10 DIAGNOSIS — R928 Other abnormal and inconclusive findings on diagnostic imaging of breast: Secondary | ICD-10-CM

## 2024-06-11 ENCOUNTER — Other Ambulatory Visit: Payer: Self-pay | Admitting: Obstetrics and Gynecology

## 2024-06-11 DIAGNOSIS — N6489 Other specified disorders of breast: Secondary | ICD-10-CM
